# Patient Record
Sex: Male | Born: 1970 | Race: Black or African American | Hispanic: No | Marital: Single | Smoking: Never smoker
Health system: Southern US, Community
[De-identification: ages and names within clinical notes are randomized; demographics above are authoritative.]

## PROBLEM LIST (undated history)

## (undated) DIAGNOSIS — F32A Depression, unspecified: Secondary | ICD-10-CM

## (undated) DIAGNOSIS — F419 Anxiety disorder, unspecified: Secondary | ICD-10-CM

## (undated) DIAGNOSIS — F329 Major depressive disorder, single episode, unspecified: Secondary | ICD-10-CM

## (undated) DIAGNOSIS — Z87898 Personal history of other specified conditions: Secondary | ICD-10-CM

## (undated) DIAGNOSIS — I10 Essential (primary) hypertension: Secondary | ICD-10-CM

## (undated) HISTORY — DX: Personal history of other specified conditions: Z87.898

## (undated) HISTORY — DX: Major depressive disorder, single episode, unspecified: F32.9

## (undated) HISTORY — DX: Anxiety disorder, unspecified: F41.9

## (undated) HISTORY — DX: Depression, unspecified: F32.A

## (undated) HISTORY — DX: Essential (primary) hypertension: I10

## (undated) HISTORY — PX: HERNIA REPAIR: SHX51

---

## 1989-03-29 DIAGNOSIS — I10 Essential (primary) hypertension: Secondary | ICD-10-CM

## 1989-03-29 HISTORY — DX: Essential (primary) hypertension: I10

## 2003-12-21 ENCOUNTER — Emergency Department (HOSPITAL_COMMUNITY): Admission: EM | Admit: 2003-12-21 | Discharge: 2003-12-21 | Payer: Self-pay | Admitting: Emergency Medicine

## 2006-09-06 ENCOUNTER — Emergency Department (HOSPITAL_COMMUNITY): Admission: EM | Admit: 2006-09-06 | Discharge: 2006-09-06 | Payer: Self-pay | Admitting: Emergency Medicine

## 2006-09-19 ENCOUNTER — Emergency Department (HOSPITAL_COMMUNITY): Admission: EM | Admit: 2006-09-19 | Discharge: 2006-09-19 | Payer: Self-pay | Admitting: Emergency Medicine

## 2018-03-14 ENCOUNTER — Encounter (HOSPITAL_COMMUNITY): Payer: Self-pay | Admitting: Emergency Medicine

## 2018-03-14 ENCOUNTER — Other Ambulatory Visit: Payer: Self-pay

## 2018-03-14 ENCOUNTER — Emergency Department (HOSPITAL_COMMUNITY)
Admission: EM | Admit: 2018-03-14 | Discharge: 2018-03-14 | Disposition: A | Discharge status: Home / Self Care | Payer: Self-pay | Attending: Emergency Medicine | Admitting: Emergency Medicine

## 2018-03-14 DIAGNOSIS — K0889 Other specified disorders of teeth and supporting structures: Secondary | ICD-10-CM | POA: Insufficient documentation

## 2018-03-14 MED ORDER — AMOXICILLIN-POT CLAVULANATE 875-125 MG PO TABS: 1.0000 | ORAL_TABLET | Freq: Two times a day (BID) | ORAL | 0 refills | Status: AC

## 2018-03-14 MED ORDER — CHLORHEXIDINE GLUCONATE 0.12 % MT SOLN: 15.0000 mL | Freq: Two times a day (BID) | OROMUCOSAL | 0 refills | Status: DC

## 2018-03-14 MED ORDER — LIDOCAINE VISCOUS HCL 2 % MT SOLN: 15.0000 mL | OROMUCOSAL | 0 refills | Status: DC | PRN

## 2018-03-14 MED ORDER — AMOXICILLIN-POT CLAVULANATE 875-125 MG PO TABS
1.0000 | ORAL_TABLET | Freq: Once | ORAL | Status: AC
Start: 2018-03-14 — End: 2018-03-14
  Administered 2018-03-14: 1 via ORAL
  Filled 2018-03-14: qty 1

## 2018-03-14 MED ORDER — BUPIVACAINE-EPINEPHRINE (PF) 0.5% -1:200000 IJ SOLN
1.8000 mL | Freq: Once | INTRAMUSCULAR | Status: AC
  Administered 2018-03-14: 1.8 mL
  Filled 2018-03-14: qty 1.8

## 2018-03-14 NOTE — ED Provider Notes (Signed)
[facility] EMERGENCY DEPARTMENT Provider Note   CSN: [id number] Arrival date & time: 03/14/18  1005     History   Chief Complaint Chief Complaint  Patient presents with  . Dental Pain    HPI IVERY Reed is a 47 y.o. male.  HPI    Mike Reed is a 47 y.o. male who presents to the Emergency Department complaining of persistent, gradually worsening, right-sided, upper and lower dental pain beginning 1 day ago. Pt describes their pain as throbbing. No remedies at home for pain relief. Pain is exacerbated by touching nearby gum and chewing. They are not followed by dentistry.  Patient reports some slight right-sided lower facial swelling, but no swelling of tongue, neck, submandibular region.  Pt denies fever, chills, difficulty breathing, difficulty swallowing.   History reviewed. No pertinent past medical history.  There are no active problems to display for this patient.   Past Surgical History:  Procedure Laterality Date  . HERNIA REPAIR          Home Medications    Prior to Admission medications   Not on File    Family History History reviewed. No pertinent family history.  Social History Social History   Tobacco Use  . Smoking status: Never Smoker  . Smokeless tobacco: Never Used  Substance Use Topics  . Alcohol use: Never    Frequency: Never  . Drug use: Never     Allergies   Patient has no allergy information on record.   Review of Systems Review of Systems  Constitutional: Negative for chills and fever.  HENT: Positive for dental problem. Negative for trouble swallowing and voice change.   Respiratory: Negative for stridor.      Physical Exam Updated Vital Signs BP 135/75 (BP Location: Right Arm)   Pulse 79   Temp 98.4 F (36.9 C) (Oral)   Resp 18   Ht 5\' 5"  (1.651 m)   Wt 68 kg   SpO2 99%   BMI 24.96 kg/m   Physical Exam Vitals signs and nursing note reviewed.  Constitutional:      General: He is not in  acute distress.    Appearance: He is well-developed. He is not diaphoretic.     Comments: Sitting comfortably in bed.  HENT:     Head: Normocephalic and atraumatic.     Comments: Dental cavities and poor oral dentition noted. Pain along teeth as depicted in image. No abscess noted. Midline uvula. No trismus. OP clear and moist. No oropharyngeal erythema or edema. Neck supple with no tenderness. Slight swelling along right mandible but no TTP or swelling of submandibular region.    Mouth/Throat:   Eyes:     General:        Right eye: No discharge.        Left eye: No discharge.     Conjunctiva/sclera: Conjunctivae normal.     Comments: EOMs normal to gross examination.  Neck:     Musculoskeletal: Normal range of motion.  Cardiovascular:     Rate and Rhythm: Normal rate and regular rhythm.     Comments: Intact, 2+ radial pulse. Abdominal:     General: There is no distension.  Musculoskeletal: Normal range of motion.  Skin:    General: Skin is warm and dry.  Neurological:     Mental Status: He is alert.     Comments: Cranial nerves intact to gross observation. Patient moves extremities without difficulty.  Psychiatric:  Behavior: Behavior normal.        Thought Content: Thought content normal.        Judgment: Judgment normal.      ED Treatments / Results  Labs (all labs ordered are listed, but only abnormal results are displayed) Labs Reviewed - No data to display  EKG None  Radiology No results found.  Procedures Procedures (including critical care time)  Medications Ordered in ED Medications  bupivacaine-epinephrine (MARCAINE W/ EPI) 0.5% -1:200000 injection 1.8 mL (1.8 mLs Infiltration Given 03/14/18 1322)     Initial Impression / Assessment and Plan / ED Course  I have reviewed the triage vital signs and the nursing notes.  Pertinent labs & imaging results that were available during my care of the patient were reviewed by me and considered in my  medical decision making (see chart for details).     Mike Reed is a 47 y.o. male who presents to ED for dental pain. No abscess requiring immediate incision and drainage. Patient is afebrile, non toxic appearing, and swallowing secretions well. Exam not concerning for Ludwig's angina or pharyngeal abscess. Will treat with Augmentin given slight facial swelling, chlorhexidine, and viscous lidocaine. I provided dental resource guide and stressed the importance of dental follow up for ultimate management of dental pain. Patient voices understanding and is agreeable to plan.  Attempted dental block, however patient did not tolerated self terminated procedure.  Final Clinical Impressions(s) / ED Diagnoses   Final diagnoses:  Pain, dental    ED Discharge Orders         Ordered    amoxicillin-clavulanate (AUGMENTIN) 875-125 MG tablet  Every 12 hours     03/14/18 1354    chlorhexidine (PERIDEX) 0.12 % solution  2 times daily     03/14/18 1354    lidocaine (XYLOCAINE) 2 % solution  As needed     03/14/18 1354           Delia ChimesMurray, Ikea Demicco B, PA-C 03/14/18 1357    Pricilla LovelessGoldston, Scott, MD 03/15/18 2303

## 2018-03-14 NOTE — Discharge Instructions (Addendum)
Please see the information and instructions below regarding your visit.  Your diagnoses today include:  1. Pain, dental    You have a dental infection. It is very important that you get evaluated by a dentist as soon as possible. Call tomorrow to schedule an appointment. Naproxen/Tylenol as needed for pain. Take your full course of antibiotics. Read the instructions below.  Tests performed today include: See side panel of your discharge paperwork for testing performed today. Vital signs are listed at the bottom of these instructions.   Medications prescribed:    Take any prescribed medications only as prescribed, and any over the counter medications only as directed on the packaging.  1. You are prescribed Augmentin, an antibiotic. Please take all of your antibiotics until finished.   You may develop abdominal discomfort or nausea from the antibiotic. If this occurs, you may take it with food. Some patients also get diarrhea with antibiotics. You may help offset this with probiotics which you can buy or get in yogurt. Do not eat or take the probiotics until 2 hours after your antibiotic. Some women develop vaginal yeast infections after antibiotics. If you develop unusual vaginal discharge after being on this medication, please see your primary care provider.   Some people develop allergies to antibiotics. Symptoms of antibiotic allergy can be mild and include a flat rash and itching. They can also be more serious and include:  ?Hives - Hives are raised, red patches of skin that are usually very itchy.  ?Lip or tongue swelling  ?Trouble swallowing or breathing  ?Blistering of the skin or mouth.  If you have any of these serious symptoms, please seek emergency medical care immediately.  2. I recommend naproxen, a non-steroidal anti-inflammatory agent (NSAID) for pain. You may take 500 mg every 12 hours as needed for pain. If still requiring this medication around the clock for acute pain  after 10 days, please see your primary healthcare provider.  You may combine this medication with Tylenol, 650 mg every 6 hours, so you are receiving something for pain every 3 hours.  This is not a long-term medication unless under the care and direction of your primary provider. Taking this medication long-term and not under the supervision of a healthcare provider could increase the risk of stomach ulcers, kidney problems, and cardiovascular problems such as high blood pressure.   Home care instructions:  Please follow any educational materials contained in this packet.   Eat a soft or liquid diet and rinse your mouth out after meals with warm water. You should see a dentist or return here at once if you have increased swelling, increased pain or uncontrolled bleeding from the site of your injury.  Follow-up instructions: It is very important that you see a dentist as soon as possible. There is a list of dentists attached to this packet if you do not have care established with a dentist already. Please give a call to a dentist of your choice tomorrow.  Return instructions:  Please return to the Emergency Department if you experience worsening symptoms.  Please seek care if you note any of the following about your dental pain:  You have increased pain not controlled with medicines.  You have swelling around your tooth, in your face or neck.  You have bleeding which starts, continues, or gets worse.  You have a fever >101 If you are unable to open your mouth Please return if you have any other emergent concerns.  Additional Information:  Your vital signs today were: BP 135/75 (BP Location: Right Arm)    Pulse 79    Temp 98.4 F (36.9 C) (Oral)    Resp 18    Ht 5\' 5"  (1.651 m)    Wt 68 kg    SpO2 99%    BMI 24.96 kg/m  If your blood pressure (BP) was elevated on multiple readings during this visit above 130 for the top number or above 80 for the bottom number, please have this  repeated by your primary care provider within one month. --------------  Thank you for allowing [place] to participate in your care today.

## 2018-03-14 NOTE — ED Triage Notes (Signed)
Pt c/o of right dental pain with swelling of face x 2 days.

## 2018-03-16 DIAGNOSIS — Z139 Encounter for screening, unspecified: Secondary | ICD-10-CM

## 2018-03-16 LAB — GLUCOSE, POCT (MANUAL RESULT ENTRY): POC Glucose: 122 mg/dl — AB (ref 70–99)

## 2018-03-16 NOTE — Congregational Nurse Program (Signed)
  Dept: [phone]   Congregational Nurse Program Note  Date of Encounter: 03/16/2018  Past Medical History: No past medical history on file.  Encounter Details:  Pt presents to clinic with c/o of tooth pain in upper and lower areas with a loose tooth; states he went to ER recently to receive treatment and pain meds to control the pain; stated he has not picked up his Rx as of yet, but is planning on picking up on today (03/16/18).  He also stated he needs a medical provider to address his hip pain and mental health conditions so he can continue to maintain taking his medications as normal.    Vitals checked and all WNL with the exception of a slight elevation with temp- 99.5;  Pt stated he has been dealing with some nasal related issues    Referred to RN Case Manager, Mike Reed to assist with setting up health care services and PCP  [facility] appt made for 03/27/18 at 8:30am

## 2018-03-23 NOTE — Congregational Nurse Program (Signed)
  Dept: [phone]   Congregational Nurse Program Note  Date of Encounter: 03/16/2018  Past Medical History: No past medical history on file.  Encounter Details:   Assessment of client after initial visit and screening with Maia PlanWilhemenia Crowder LPN. Client has history of Mental Illness and was released from [facility] in September,[place] with medications. He has not yet established care with a Mental Health Provider. Discussed options with client. Recommended to client to establish care with [facility] services ASAP. Discussed with client that [facility] does walk in intakes M-F 0800-1200 and 1 to 4. Client encouraged to go for a walk in tomorrow 03/17/18 and to discuss with them he has about 1 weeks medications left on his prescription. Client states understanding. Also discussed with client good oral hygiene. Client recently seen in [facility] for dental pain and prescribed medications that he has not yet began. Client states he will get them today. Encouraged client to brush and do rinses per ER MD and to use lidocaine as directed to help with pain. Client states understanding of all above. [facility] [place] and appointment secured for 0830 am on 03/27/18. Will call to follow up with client after his initial appointment and to continue to follow as needed as Case Manager for [facility]. Client was enrolled in [facility] today also by [employer]. Also discussed with client area food pantry's and names given.  Mike NodalPatricia R Lamona Eimer RN

## 2018-03-27 ENCOUNTER — Ambulatory Visit: Payer: Self-pay | Admitting: Physician Assistant

## 2018-04-03 ENCOUNTER — Telehealth: Payer: Self-pay

## 2018-04-03 NOTE — Telephone Encounter (Signed)
Second attempt to follow up with client who had a new patient Appointment with [facility] and did not come to his appointment. Have attempted to contact client to assist in a reschedule. Have been unable to reach.   Francee Nodal RN

## 2018-04-18 ENCOUNTER — Encounter: Payer: Self-pay | Admitting: Physician Assistant

## 2018-04-19 ENCOUNTER — Encounter: Payer: Self-pay | Admitting: Physician Assistant

## 2018-04-26 ENCOUNTER — Ambulatory Visit: Payer: Self-pay | Admitting: Physician Assistant

## 2018-04-26 ENCOUNTER — Encounter: Payer: Self-pay | Admitting: Physician Assistant

## 2018-04-26 VITALS — BP 106/68 | HR 76 | Temp 97.3°F | Ht 65.0 in | Wt 153.0 lb

## 2018-04-26 DIAGNOSIS — Z13220 Encounter for screening for lipoid disorders: Secondary | ICD-10-CM

## 2018-04-26 DIAGNOSIS — Z131 Encounter for screening for diabetes mellitus: Secondary | ICD-10-CM

## 2018-04-26 DIAGNOSIS — F39 Unspecified mood [affective] disorder: Secondary | ICD-10-CM

## 2018-04-26 DIAGNOSIS — R1031 Right lower quadrant pain: Secondary | ICD-10-CM

## 2018-04-26 DIAGNOSIS — Z125 Encounter for screening for malignant neoplasm of prostate: Secondary | ICD-10-CM

## 2018-04-26 DIAGNOSIS — Z7689 Persons encountering health services in other specified circumstances: Secondary | ICD-10-CM

## 2018-04-26 NOTE — Progress Notes (Signed)
BP 106/68 (BP Location: Left Arm, Patient Position: Sitting, Cuff Size: Normal)   Pulse 76   Temp (!) 97.3 F (36.3 C)   Ht 5\' 5"  (1.651 m)   Wt 153 lb (69.4 kg)   SpO2 98%   BMI 25.46 kg/m    Subjective:    Patient ID: Mike Reed, male    DOB: Feb 09, 1971, 48 y.o.   MRN: [id number]  HPI: Mike Reed is a 48 y.o. male presenting on 04/26/2018 for New Patient (Initial Visit)   HPI  Chief Complaint  Patient presents with  . New Patient (Initial Visit)   Pt states his most pressing issue is his difficulty walking. Pt says he Started having problems with walking in 2016.  He says he was in a wheelchair for 2 years.  He was seen at [facility] spinal clinic.  He says they tried injections.  He thinks it might be from the hernia sergery (done by Dr Augustine Radar in [place])  he had done in 2015.  In 2019 he was scheduled to return to the surgeon but he says the prison people wouldn't take him.  He got out of prison in 2019.  He was incarcerated at the time of his surgery in 2015 as well.    Relevant past medical, surgical, family and social history reviewed and updated as indicated. Interim medical history since our last visit reviewed. Allergies and medications reviewed and updated.  No current outpatient medications on file.   Review of Systems  Constitutional: Negative for appetite change, chills, diaphoresis, fatigue, fever and unexpected weight change.  HENT: Positive for dental problem, facial swelling and trouble swallowing. Negative for congestion, drooling, ear pain, hearing loss, mouth sores, sneezing, sore throat and voice change.   Eyes: Negative for pain, discharge, redness, itching and visual disturbance.  Respiratory: Positive for cough and shortness of breath. Negative for choking and wheezing.   Cardiovascular: Positive for chest pain. Negative for palpitations and leg swelling.  Gastrointestinal: Positive for abdominal pain. Negative for blood in stool,  constipation, diarrhea and vomiting.  Endocrine: Negative for cold intolerance, heat intolerance and polydipsia.  Genitourinary: Positive for hematuria. Negative for decreased urine volume and dysuria.  Musculoskeletal: Positive for arthralgias, back pain and gait problem.  Skin: Negative for rash.  Allergic/Immunologic: Negative for environmental allergies.  Neurological: Negative for seizures, syncope, light-headedness and headaches.  Hematological: Negative for adenopathy.  Psychiatric/Behavioral: Positive for dysphoric mood. Negative for agitation and suicidal ideas. The patient is nervous/anxious.     Per HPI unless specifically indicated above     Objective:    BP 106/68 (BP Location: Left Arm, Patient Position: Sitting, Cuff Size: Normal)   Pulse 76   Temp (!) 97.3 F (36.3 C)   Ht 5\' 5"  (1.651 m)   Wt 153 lb (69.4 kg)   SpO2 98%   BMI 25.46 kg/m   Wt Readings from Last 3 Encounters:  04/26/18 153 lb (69.4 kg)  03/16/18 150 lb (68 kg)  03/14/18 150 lb (68 kg)    Physical Exam Vitals signs reviewed.  Constitutional:      Appearance: He is well-developed.  HENT:     Head: Normocephalic and atraumatic.     Mouth/Throat:     Pharynx: No oropharyngeal exudate.  Eyes:     Conjunctiva/sclera: Conjunctivae normal.     Pupils: Pupils are equal, round, and reactive to light.  Neck:     Musculoskeletal: Neck supple.     Thyroid: No thyromegaly.  Cardiovascular:     Rate and Rhythm: Normal rate and regular rhythm.     Pulses:          Dorsalis pedis pulses are 2+ on the right side and 2+ on the left side.  Pulmonary:     Effort: Pulmonary effort is normal.     Breath sounds: Normal breath sounds. No wheezing or rales.  Abdominal:     General: Bowel sounds are normal.     Palpations: Abdomen is soft. There is no mass.     Tenderness: There is no abdominal tenderness.  Musculoskeletal:     Right hip: He exhibits tenderness. He exhibits no bony tenderness and no  swelling.     Right lower leg: No edema.     Left lower leg: No edema.     Comments: Soft tissue tenderness R groin.  No hernia palpable.  Well-healed surgical scar from previous hernia surgery.  No decrease ROM R hip but increases pain  Lymphadenopathy:     Cervical: No cervical adenopathy.  Skin:    General: Skin is warm and dry.     Findings: No rash.  Neurological:     Mental Status: He is alert and oriented to person, place, and time.  Psychiatric:        Behavior: Behavior normal.        Thought Content: Thought content normal.         Assessment & Plan:    Encounter Diagnoses  Name Primary?  . Encounter to establish care Yes  . Right groin pain   . Screening cholesterol level   . Screening for diabetes mellitus   . Screening for prostate cancer   . Mood disorder (HCC)     -Pt to go to [facility] for anxiety/depression -Records requested from spine center and dr Augustine Radar -will get baseline labs -pt to follow up 1 month.  RTO sooner prn

## 2018-04-28 ENCOUNTER — Other Ambulatory Visit (HOSPITAL_COMMUNITY)
Admission: RE | Admit: 2018-04-28 | Discharge: 2018-04-28 | Disposition: A | Discharge status: Home / Self Care | Payer: Self-pay | Source: Ambulatory Visit | Attending: Physician Assistant | Admitting: Physician Assistant

## 2018-04-28 DIAGNOSIS — Z125 Encounter for screening for malignant neoplasm of prostate: Secondary | ICD-10-CM

## 2018-04-28 DIAGNOSIS — Z131 Encounter for screening for diabetes mellitus: Secondary | ICD-10-CM

## 2018-04-28 DIAGNOSIS — Z13220 Encounter for screening for lipoid disorders: Secondary | ICD-10-CM

## 2018-04-28 LAB — COMPREHENSIVE METABOLIC PANEL
ALBUMIN: 4.1 g/dL (ref 3.5–5.0)
ALT: 12 U/L (ref 0–44)
ANION GAP: 8 (ref 5–15)
AST: 15 U/L (ref 15–41)
Alkaline Phosphatase: 53 U/L (ref 38–126)
BUN: 8 mg/dL (ref 6–20)
CO2: 27 mmol/L (ref 22–32)
Calcium: 8.8 mg/dL — ABNORMAL LOW (ref 8.9–10.3)
Chloride: 105 mmol/L (ref 98–111)
Creatinine, Ser: 1.06 mg/dL (ref 0.61–1.24)
GFR calc Af Amer: >60 mL/min (ref >60)
GFR calc non Af Amer: >60 mL/min (ref >60)
GLUCOSE: 91 mg/dL (ref 70–99)
POTASSIUM: 4.1 mmol/L (ref 3.5–5.1)
SODIUM: 140 mmol/L (ref 135–145)
TOTAL PROTEIN: 7.2 g/dL (ref 6.5–8.1)
Total Bilirubin: 0.6 mg/dL (ref 0.3–1.2)

## 2018-04-28 LAB — LIPID PANEL
CHOL/HDL RATIO: 4.7 ratio
Cholesterol: 196 mg/dL (ref 0–200)
HDL: 42 mg/dL (ref >40)
LDL Cholesterol: 143 mg/dL — ABNORMAL HIGH (ref 0–99)
Triglycerides: 54 mg/dL (ref <150)
VLDL: 11 mg/dL (ref 0–40)

## 2018-04-28 LAB — PSA: Prostatic Specific Antigen: 0.98 ng/mL (ref 0.00–4.00)

## 2018-04-28 LAB — HEMOGLOBIN A1C
Hgb A1c MFr Bld: 5.9 % — ABNORMAL HIGH (ref 4.8–5.6)
Mean Plasma Glucose: 122.63 mg/dL

## 2018-05-25 ENCOUNTER — Encounter: Payer: Self-pay | Admitting: Physician Assistant

## 2018-05-25 ENCOUNTER — Ambulatory Visit: Payer: Self-pay | Admitting: Physician Assistant

## 2018-05-25 VITALS — BP 119/65 | HR 68 | Temp 97.0°F | Ht 65.0 in | Wt 153.0 lb

## 2018-05-25 DIAGNOSIS — R7303 Prediabetes: Secondary | ICD-10-CM

## 2018-05-25 DIAGNOSIS — E785 Hyperlipidemia, unspecified: Secondary | ICD-10-CM

## 2018-05-25 DIAGNOSIS — R1031 Right lower quadrant pain: Secondary | ICD-10-CM

## 2018-05-25 MED ORDER — SIMVASTATIN 20 MG PO TABS: 20.0000 mg | ORAL_TABLET | Freq: Every day | ORAL | 4 refills | Status: AC

## 2018-05-25 NOTE — Patient Instructions (Signed)

## 2018-05-25 NOTE — Progress Notes (Signed)
BP 119/65 (BP Location: Right Arm, Patient Position: Sitting, Cuff Size: Normal)   Pulse 68   Temp (!) 97 F (36.1 C)   Ht 5\' 5"  (1.651 m)   Wt 153 lb (69.4 kg)   SpO2 97%   BMI 25.46 kg/m    Subjective:    Patient ID: Mike Reed, male    DOB: 1971/03/12, 48 y.o.   MRN: [id number]  HPI: Mike Reed is a 48 y.o. male presenting on 05/25/2018 for Follow-up   HPI   Records from dr Radene Gunning at [facility] received- question inguinal neuorpathy  Relevant past medical, surgical, family and social history reviewed and updated as indicated. Interim medical history since our last visit reviewed. Allergies and medications reviewed and updated.  No current outpatient medications on file.   Review of Systems  Constitutional: Positive for diaphoresis and unexpected weight change. Negative for appetite change, chills, fatigue and fever.  HENT: Positive for dental problem. Negative for congestion, drooling, ear pain, facial swelling, hearing loss, mouth sores, sneezing, sore throat, trouble swallowing and voice change.   Eyes: Positive for redness. Negative for pain, discharge, itching and visual disturbance.  Respiratory: Positive for cough and shortness of breath. Negative for choking and wheezing.   Cardiovascular: Positive for leg swelling. Negative for chest pain and palpitations.  Gastrointestinal: Positive for abdominal pain. Negative for blood in stool, constipation, diarrhea and vomiting.  Endocrine: Positive for heat intolerance. Negative for cold intolerance and polydipsia.  Genitourinary: Negative for decreased urine volume, dysuria and hematuria.  Musculoskeletal: Positive for arthralgias, back pain and gait problem.  Skin: Negative for rash.  Allergic/Immunologic: Negative for environmental allergies.  Neurological: Positive for headaches. Negative for seizures, syncope and light-headedness.  Hematological: Negative for adenopathy.   Psychiatric/Behavioral: Positive for dysphoric mood. Negative for agitation and suicidal ideas. The patient is nervous/anxious.     Per HPI unless specifically indicated above     Objective:    BP 119/65 (BP Location: Right Arm, Patient Position: Sitting, Cuff Size: Normal)   Pulse 68   Temp (!) 97 F (36.1 C)   Ht 5\' 5"  (1.651 m)   Wt 153 lb (69.4 kg)   SpO2 97%   BMI 25.46 kg/m   Wt Readings from Last 3 Encounters:  05/25/18 153 lb (69.4 kg)  04/26/18 153 lb (69.4 kg)  03/16/18 150 lb (68 kg)    Physical Exam Vitals signs reviewed.  Constitutional:      Appearance: He is well-developed.  HENT:     Head: Normocephalic and atraumatic.  Neck:     Musculoskeletal: Neck supple.  Cardiovascular:     Rate and Rhythm: Normal rate and regular rhythm.  Pulmonary:     Effort: Pulmonary effort is normal.     Breath sounds: Normal breath sounds. No wheezing.  Abdominal:     General: Bowel sounds are normal.     Palpations: Abdomen is soft.     Tenderness: There is no abdominal tenderness.  Lymphadenopathy:     Cervical: No cervical adenopathy.  Skin:    General: Skin is warm and dry.  Neurological:     Mental Status: He is alert and oriented to person, place, and time.  Psychiatric:        Behavior: Behavior normal.     Results for orders placed or performed during the hospital encounter of 04/28/18  Hemoglobin A1c  Result Value Ref Range   Hgb A1c MFr Bld 5.9 (H) 4.8 - 5.6 %  Mean Plasma Glucose 122.63 mg/dL  PSA  Result Value Ref Range   Prostatic Specific Antigen 0.98 0.00 - 4.00 ng/mL  Lipid panel  Result Value Ref Range   Cholesterol 196 0 - 200 mg/dL   Triglycerides 54 <338 mg/dL   HDL 42 >32 mg/dL   Total CHOL/HDL Ratio 4.7 RATIO   VLDL 11 0 - 40 mg/dL   LDL Cholesterol 919 (H) 0 - 99 mg/dL  Comprehensive metabolic panel  Result Value Ref Range   Sodium 140 135 - 145 mmol/L   Potassium 4.1 3.5 - 5.1 mmol/L   Chloride 105 98 - 111 mmol/L   CO2 27  22 - 32 mmol/L   Glucose, Bld 91 70 - 99 mg/dL   BUN 8 6 - 20 mg/dL   Creatinine, Ser 1.66 0.61 - 1.24 mg/dL   Calcium 8.8 (L) 8.9 - 10.3 mg/dL   Total Protein 7.2 6.5 - 8.1 g/dL   Albumin 4.1 3.5 - 5.0 g/dL   AST 15 15 - 41 U/L   ALT 12 0 - 44 U/L   Alkaline Phosphatase 53 38 - 126 U/L   Total Bilirubin 0.6 0.3 - 1.2 mg/dL   GFR calc non Af Amer >60 >60 mL/min   GFR calc Af Amer >60 >60 mL/min   Anion gap 8 5 - 15      Assessment & Plan:    Encounter Diagnoses  Name Primary?  . Hyperlipidemia, unspecified hyperlipidemia type Yes  . Prediabetes   . 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  Pt to RTO sooner prn

## 2018-05-31 ENCOUNTER — Other Ambulatory Visit: Payer: Self-pay | Admitting: Physician Assistant

## 2018-05-31 DIAGNOSIS — R1031 Right lower quadrant pain: Secondary | ICD-10-CM

## 2018-06-08 ENCOUNTER — Ambulatory Visit (HOSPITAL_COMMUNITY)
Admission: RE | Admit: 2018-06-08 | Discharge: 2018-06-08 | Disposition: A | Discharge status: Home / Self Care | Payer: Self-pay | Source: Ambulatory Visit | Attending: Physician Assistant | Admitting: Physician Assistant

## 2018-06-08 ENCOUNTER — Other Ambulatory Visit: Payer: Self-pay

## 2018-06-08 DIAGNOSIS — R1031 Right lower quadrant pain: Secondary | ICD-10-CM | POA: Insufficient documentation

## 2018-06-21 ENCOUNTER — Emergency Department (HOSPITAL_COMMUNITY)
Admission: EM | Admit: 2018-06-21 | Discharge: 2018-06-21 | Disposition: A | Discharge status: Home / Self Care | Payer: Self-pay | Attending: Emergency Medicine | Admitting: Emergency Medicine

## 2018-06-21 ENCOUNTER — Other Ambulatory Visit: Payer: Self-pay

## 2018-06-21 ENCOUNTER — Encounter (HOSPITAL_COMMUNITY): Payer: Self-pay | Admitting: Emergency Medicine

## 2018-06-21 DIAGNOSIS — K047 Periapical abscess without sinus: Secondary | ICD-10-CM | POA: Insufficient documentation

## 2018-06-21 DIAGNOSIS — I10 Essential (primary) hypertension: Secondary | ICD-10-CM | POA: Insufficient documentation

## 2018-06-21 MED ORDER — HYDROCODONE-ACETAMINOPHEN 5-325 MG PO TABS: ORAL_TABLET | ORAL | 0 refills | Status: AC

## 2018-06-21 MED ORDER — CLINDAMYCIN HCL 150 MG PO CAPS: 300.0000 mg | ORAL_CAPSULE | Freq: Four times a day (QID) | ORAL | 0 refills | Status: AC

## 2018-06-21 NOTE — ED Triage Notes (Signed)
Patient has right lower tooth pain with possible abscess.

## 2018-06-21 NOTE — ED Provider Notes (Signed)
[facility] EMERGENCY DEPARTMENT Provider Note   CSN: [id number] Arrival date & time: 06/21/18  2038    History   Chief Complaint Chief Complaint  Patient presents with  . Dental Pain    HPI Mike Reed is a 48 y.o. male.     HPI   Mike Reed is a 48 y.o. male who presents to the Emergency Department complaining of recurrent right lower dental pain.  He noticed increasing pain to the right lower tooth that began yesterday.  Woke today with mild swelling of his right lower face.  He describes the pain as constant and worse with chewing.  He denies fever, chills, difficulty swallowing and sore throat.  He tried OTC pain relievers without relief.  He does not have a dentist, but [facility].      Past Medical History:  Diagnosis Date  . Anxiety   . Depression   . History of prediabetes   . Hypertension 1991    There are no active problems to display for this patient.   Past Surgical History:  Procedure Laterality Date  . HERNIA REPAIR Right         Home Medications    Prior to Admission medications   Medication Sig Start Date End Date Taking? Authorizing Provider  simvastatin (ZOCOR) 20 MG tablet Take 1 tablet (20 mg total) by mouth at bedtime. 05/25/18   Jacquelin Hawking, PA-C    Family History Family History  Problem Relation Age of Onset  . Hypertension Mother   . Diabetes Mother   . Diabetes Paternal Aunt   . Heart disease Paternal Aunt     Social History Social History   Tobacco Use  . Smoking status: Never Smoker  . Smokeless tobacco: Never Used  Substance Use Topics  . Alcohol use: Never    Frequency: Never  . Drug use: Never     Allergies   Patient has no known allergies.   Review of Systems Review of Systems  Constitutional: Negative for appetite change, chills and fever.  HENT: Positive for dental problem and facial swelling. Negative for congestion, sore throat and trouble swallowing.    Musculoskeletal: Negative for neck pain and neck stiffness.  Neurological: Negative for dizziness, facial asymmetry and headaches.  Hematological: Negative for adenopathy.     Physical Exam Updated Vital Signs BP (!) 132/93 (BP Location: Right Arm)   Pulse 63   Temp 98.3 F (36.8 C) (Oral)   Resp 20   Ht 5\' 5"  (1.651 m)   Wt 68 kg   SpO2 99%   BMI 24.96 kg/m   Physical Exam Vitals signs and nursing note reviewed.  Constitutional:      General: He is not in acute distress.    Appearance: He is well-developed.  HENT:     Head: Normocephalic and atraumatic.     Jaw: No trismus.     Comments: Mild edema localized to the right lower jaw.      Right Ear: Tympanic membrane and ear canal normal.     Left Ear: Tympanic membrane and ear canal normal.     Mouth/Throat:     Mouth: Mucous membranes are moist.     Dentition: Dental tenderness and dental caries present. No dental abscesses.     Pharynx: Oropharynx is clear. Uvula midline. No uvula swelling.     Comments: ttp of the right lower first molar with dental decay present.  Mild erythema at the gingiva.  No fluctuance .  No trismus.   Neck:     Musculoskeletal: Normal range of motion and neck supple.  Cardiovascular:     Rate and Rhythm: Normal rate and regular rhythm.     Heart sounds: Normal heart sounds. No murmur.  Pulmonary:     Effort: Pulmonary effort is normal.     Breath sounds: Normal breath sounds.  Musculoskeletal: Normal range of motion.  Lymphadenopathy:     Cervical: No cervical adenopathy.  Skin:    General: Skin is warm.  Neurological:     General: No focal deficit present.     Mental Status: He is alert.     Motor: No abnormal muscle tone.     Coordination: Coordination normal.      ED Treatments / Results  Labs (all labs ordered are listed, but only abnormal results are displayed) Labs Reviewed - No data to display  EKG None  Radiology No results found.  Procedures Procedures  (including critical care time)  Medications Ordered in ED Medications - No data to display   Initial Impression / Assessment and Plan / ED Course  I have reviewed the triage vital signs and the nursing notes.  Pertinent labs & imaging results that were available during my care of the patient were reviewed by me and considered in my medical decision making (see chart for details).        Pt well appearing.  Mild edema of the right lower jaw, no fluctuance of surrounding gingiva, no drainable abscess, likely periapical.  No concerning sx's for Ludwig's angina.  Widespread dental decay.  Rx's for clindamycin and short course of hydrocodone.  Pt advised that he will need to arrange f/u appt with dentistry.      Final Clinical Impressions(s) / ED Diagnoses   Final diagnoses:  Dental abscess    ED Discharge Orders    None       Rosey Bath 06/21/18 2229    Maia Plan, MD 06/22/18 1217

## 2018-06-21 NOTE — Discharge Instructions (Addendum)
Frequent, warm salt water rinses to your mouth.  Take the antibiotic as directed until its finished.  Follow-up with a dentist soon

## 2018-08-23 ENCOUNTER — Ambulatory Visit: Payer: Self-pay | Admitting: Physician Assistant

## 2020-01-02 IMAGING — US RIGHT LOWER EXTREMITY SOFT TISSUE ULTRASOUND LIMITED
1 series · 12 of 12 positions shown · non-contrast
Comparison: None.

CLINICAL DATA: Initial evaluation for right groin pain. History of
prior hernia repair with mesh placement.

EXAM:
ULTRASOUND RIGHT LOWER EXTREMITY LIMITED
TECHNIQUE: Ultrasound examination of the lower extremity soft tissues was
performed in the area of clinical concern.

[Series 1: right lower extremity soft tissue ultrasound limit · 12 acquisitions, 12 frames shown]
[im 1/12]
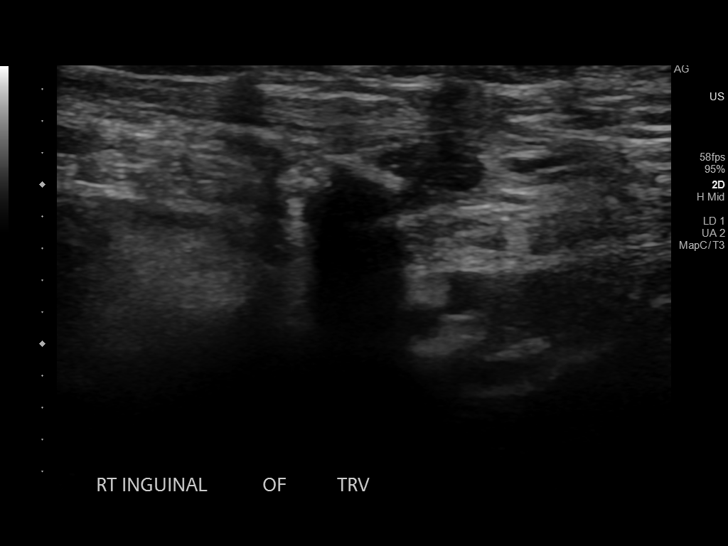
[im 2/12]
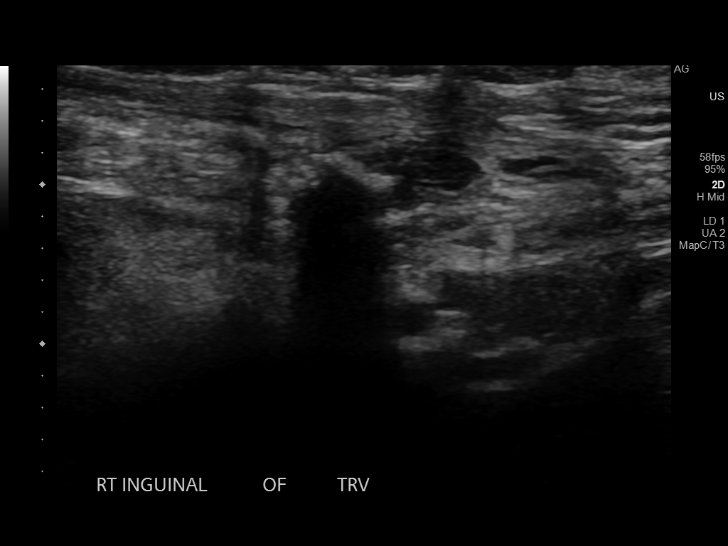
[im 3/12]
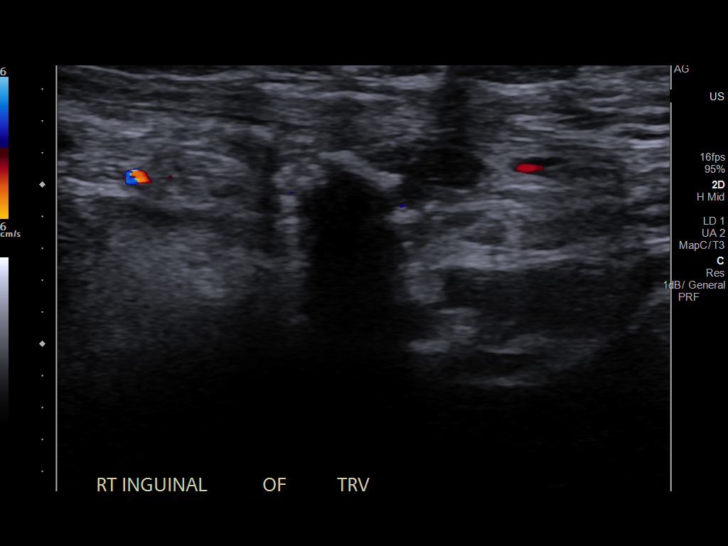
[im 4/12]
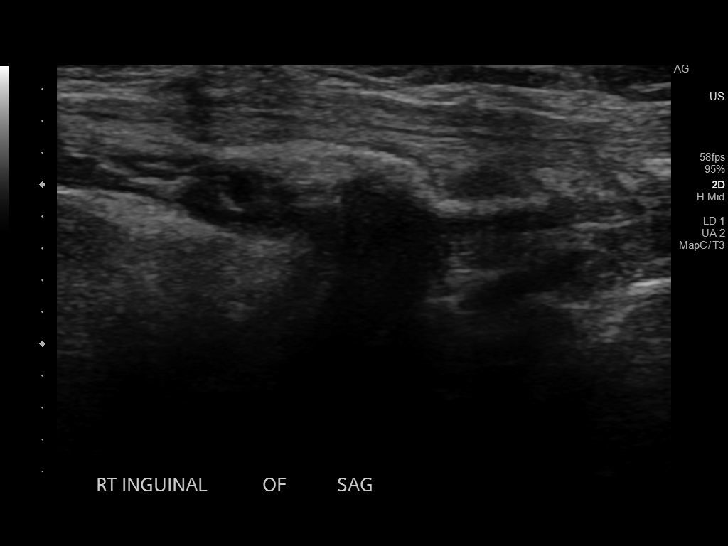
[im 5/12]
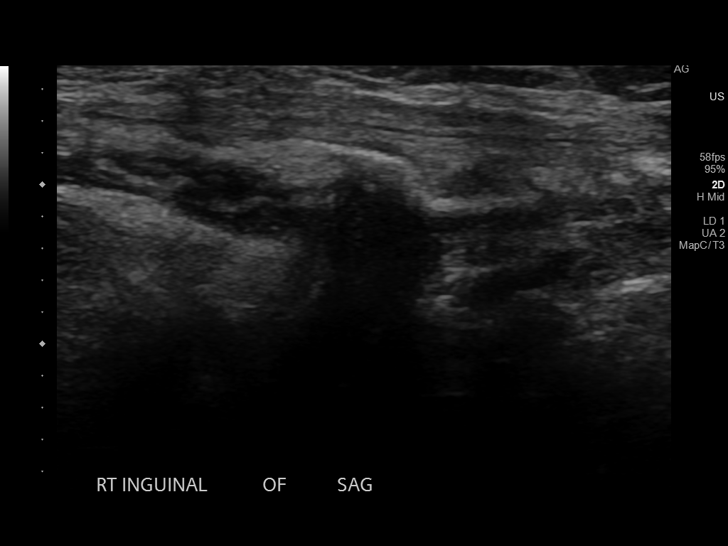
[im 6/12]
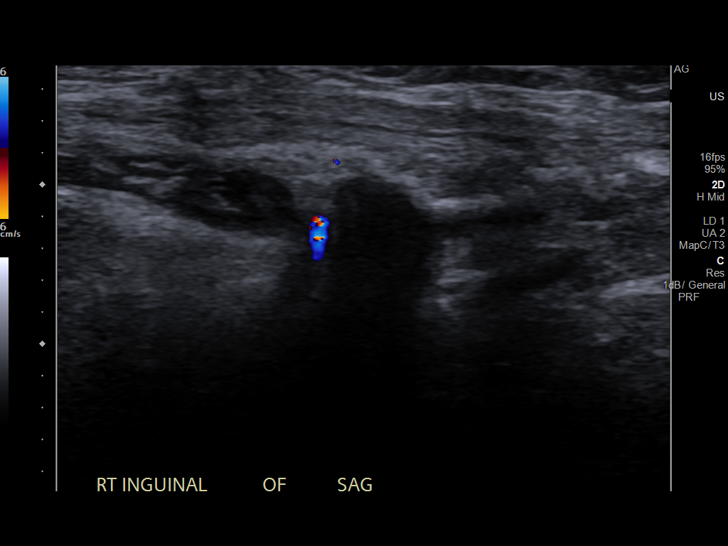
[im 7/12]
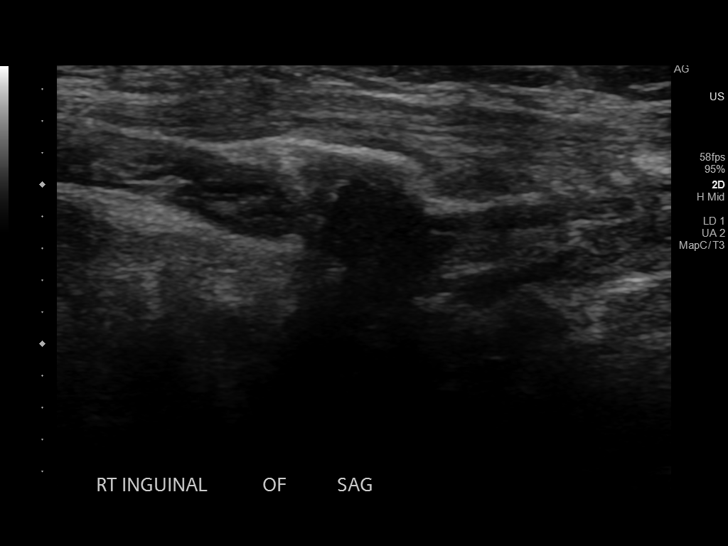
[im 8/12]
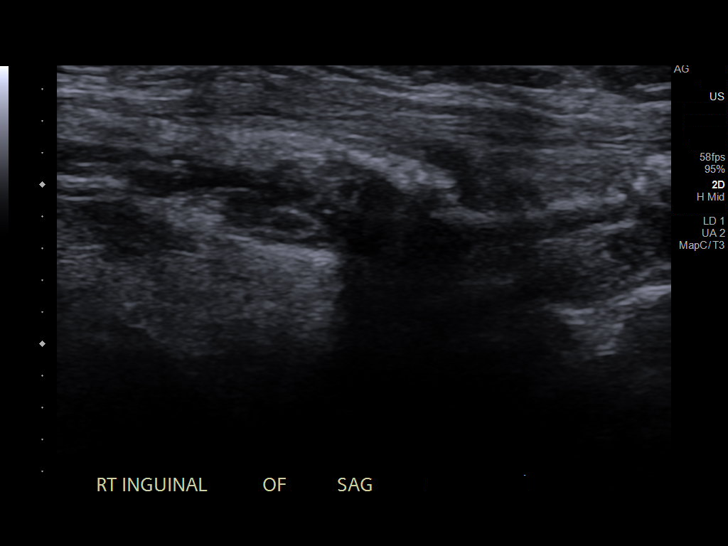
[im 9/12]
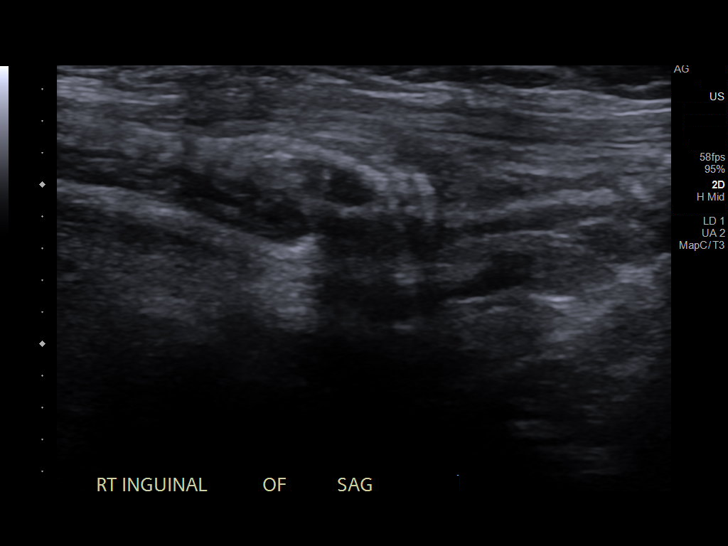
[im 10/12]
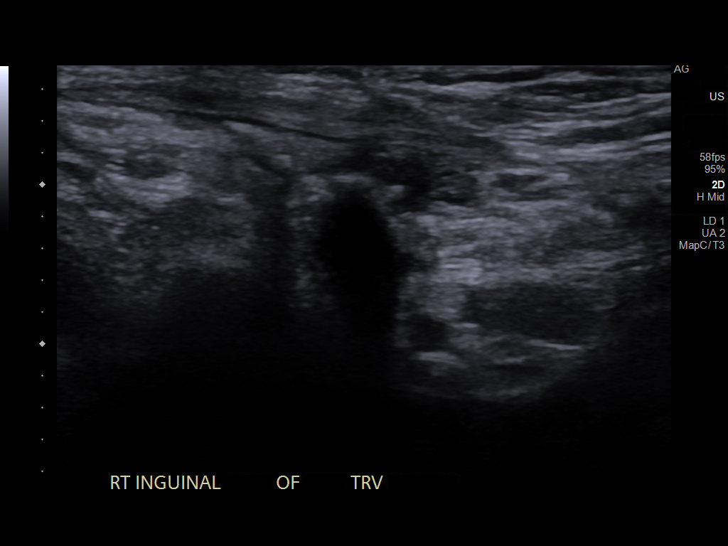
[im 11/12]
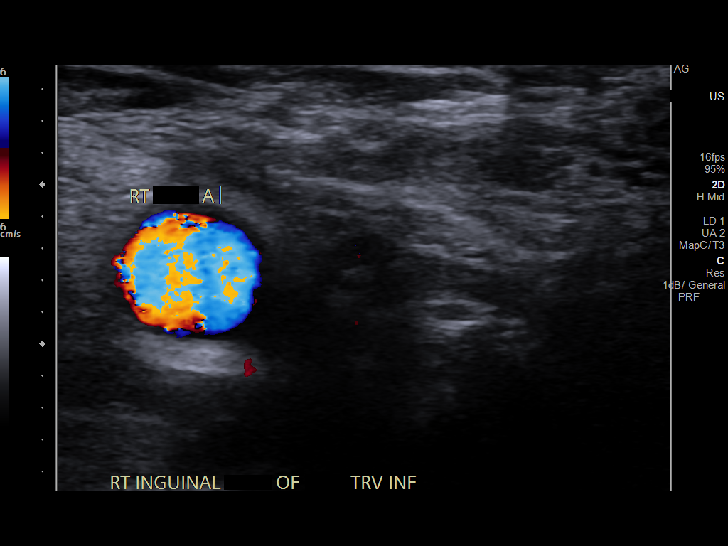
[im 12/12]
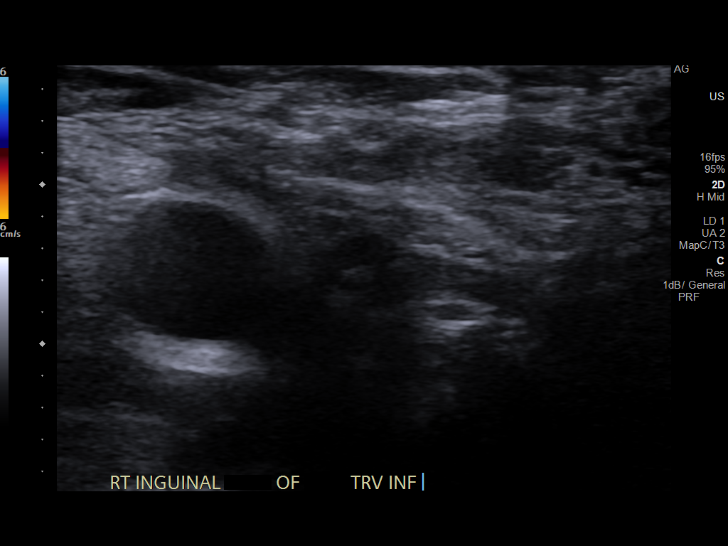

[12 of 12 positions shown; findings below may reference images not displayed]

FINDINGS: Targeted ultrasound of the right inguinal region demonstrates a
shadowing echogenic lesion measuring 0.9 x 0.8 cm, likely reflecting
the hernia repair mesh. This does not change position with Valsalva.
No other residual or recurrent hernia identified. No adenopathy,
mass lesion, or abnormal collection.
IMPRESSION: 1. 9 mm shadowing echogenic lesion within the right inguinal region,
most likely reflecting the hernia repair mesh. No residual or
recurrent hernia identified.
2. No other mass lesion, collection, or adenopathy. No other acute
abnormality identified.
